# Patient Record
Sex: Male | Born: 1980 | Hispanic: Yes | Marital: Married | Smoking: Former smoker
Health system: Southern US, Community
[De-identification: ages and names within clinical notes are randomized; demographics above are authoritative.]

---

## 2014-10-16 ENCOUNTER — Ambulatory Visit: Payer: Self-pay | Attending: Internal Medicine

## 2014-12-18 ENCOUNTER — Encounter: Payer: Self-pay | Admitting: Internal Medicine

## 2014-12-18 ENCOUNTER — Encounter (HOSPITAL_COMMUNITY): Payer: Self-pay

## 2014-12-18 ENCOUNTER — Ambulatory Visit: Payer: Self-pay | Attending: Internal Medicine | Admitting: Internal Medicine

## 2014-12-18 ENCOUNTER — Emergency Department (HOSPITAL_COMMUNITY): Payer: Self-pay

## 2014-12-18 ENCOUNTER — Emergency Department (HOSPITAL_COMMUNITY)
Admission: EM | Admit: 2014-12-18 | Discharge: 2014-12-18 | Disposition: A | Discharge status: Home / Self Care | Payer: Self-pay | Attending: Emergency Medicine | Admitting: Emergency Medicine

## 2014-12-18 VITALS — BP 135/89 | HR 76 | Temp 98.8°F | Resp 16 | Wt 154.8 lb

## 2014-12-18 DIAGNOSIS — M62830 Muscle spasm of back: Secondary | ICD-10-CM | POA: Insufficient documentation

## 2014-12-18 DIAGNOSIS — Z87891 Personal history of nicotine dependence: Secondary | ICD-10-CM | POA: Insufficient documentation

## 2014-12-18 DIAGNOSIS — G8929 Other chronic pain: Secondary | ICD-10-CM | POA: Insufficient documentation

## 2014-12-18 DIAGNOSIS — M25551 Pain in right hip: Secondary | ICD-10-CM | POA: Insufficient documentation

## 2014-12-18 DIAGNOSIS — M545 Low back pain, unspecified: Secondary | ICD-10-CM | POA: Insufficient documentation

## 2014-12-18 DIAGNOSIS — Z833 Family history of diabetes mellitus: Secondary | ICD-10-CM

## 2014-12-18 DIAGNOSIS — Z139 Encounter for screening, unspecified: Secondary | ICD-10-CM | POA: Insufficient documentation

## 2014-12-18 DIAGNOSIS — M6281 Muscle weakness (generalized): Secondary | ICD-10-CM | POA: Insufficient documentation

## 2014-12-18 DIAGNOSIS — M549 Dorsalgia, unspecified: Secondary | ICD-10-CM

## 2014-12-18 LAB — COMPLETE METABOLIC PANEL WITH GFR
ALK PHOS: 73 U/L (ref 39–117)
ALT: 76 U/L — AB (ref 0–53)
AST: 34 U/L (ref 0–37)
Albumin: 4.3 g/dL (ref 3.5–5.2)
BILIRUBIN TOTAL: 0.5 mg/dL (ref 0.2–1.2)
BUN: 11 mg/dL (ref 6–23)
CO2: 24 mEq/L (ref 19–32)
Calcium: 9.1 mg/dL (ref 8.4–10.5)
Chloride: 104 mEq/L (ref 96–112)
Creat: 0.77 mg/dL (ref 0.50–1.35)
GFR, Est African American: >89 mL/min
Glucose, Bld: 103 mg/dL — ABNORMAL HIGH (ref 70–99)
Potassium: 4.4 mEq/L (ref 3.5–5.3)
SODIUM: 137 meq/L (ref 135–145)
Total Protein: 6.7 g/dL (ref 6.0–8.3)

## 2014-12-18 LAB — CBC WITH DIFFERENTIAL/PLATELET
BASOS ABS: 0.1 10*3/uL (ref 0.0–0.1)
Basophils Relative: 1 % (ref 0–1)
Eosinophils Absolute: 0.7 10*3/uL (ref 0.0–0.7)
Eosinophils Relative: 9 % — ABNORMAL HIGH (ref 0–5)
HEMATOCRIT: 45.6 % (ref 39.0–52.0)
Hemoglobin: 15.9 g/dL (ref 13.0–17.0)
Lymphocytes Relative: 33 % (ref 12–46)
Lymphs Abs: 2.5 10*3/uL (ref 0.7–4.0)
MCH: 30.3 pg (ref 26.0–34.0)
MCHC: 34.9 g/dL (ref 30.0–36.0)
MCV: 86.9 fL (ref 78.0–100.0)
MPV: 10.8 fL (ref 8.6–12.4)
Monocytes Absolute: 0.8 10*3/uL (ref 0.1–1.0)
Monocytes Relative: 10 % (ref 3–12)
NEUTROS ABS: 3.6 10*3/uL (ref 1.7–7.7)
NEUTROS PCT: 47 % (ref 43–77)
PLATELETS: 294 10*3/uL (ref 150–400)
RBC: 5.25 MIL/uL (ref 4.22–5.81)
RDW: 13.9 % (ref 11.5–15.5)
WBC: 7.6 10*3/uL (ref 4.0–10.5)

## 2014-12-18 LAB — LIPID PANEL
Cholesterol: 206 mg/dL — ABNORMAL HIGH (ref 0–200)
HDL: 21 mg/dL — AB (ref >=40)
Total CHOL/HDL Ratio: 9.8 Ratio
Triglycerides: 435 mg/dL — ABNORMAL HIGH (ref <150)

## 2014-12-18 LAB — TSH: TSH: 0.703 u[IU]/mL (ref 0.350–4.500)

## 2014-12-18 MED ORDER — IBUPROFEN 600 MG PO TABS: 600.0000 mg | ORAL_TABLET | Freq: Three times a day (TID) | ORAL | Status: AC | PRN

## 2014-12-18 MED ORDER — CYCLOBENZAPRINE HCL 10 MG PO TABS: 10.0000 mg | ORAL_TABLET | Freq: Every day | ORAL | Status: AC

## 2014-12-18 NOTE — Progress Notes (Signed)
In house interpreter used Patient here to establish care Complains of lower back pain and pain to his right hip and leg Patient did state he fell a few years back and that could be what is causing his pain

## 2014-12-18 NOTE — ED Provider Notes (Signed)
CSN: [id number]     Arrival date & time 12/18/14  1528 History  This chart was scribed for Charles Peliffany Zenia Guest, PA-C, working with Blake DivineJohn Wofford, MD by Elon SpannerGarrett Cook, ED Scribe. This patient was seen in room TR07C/TR07C and the patient's care was started at 4:13 PM.   Chief Complaint  Patient presents with  . Back Pain  . Hip Pain   The history is provided by the patient. A language interpreter was used.   HPI Comments: Charles Dodson is a spanish speaking   34 y.o. male with a history of chronic back pain who [facility] where he was prescribed Flexeril and ibuprofen, which have not yet been filled, and told to go to he hospital for imaging of his back, it does not appear that he was supposed to check into the emergency department as he tells me he already saw the doctor and his here for his xrays.  He states that initially his back pain onset several years ago after a fall from a significant height but this episode has lasted 8-9 moths and is rated currently 4-5/10.  He also notes radiation down both his legs with right worse than left and some bilateral leg weakness.  Patient denies bowel or bladder incontinence, fever.    Patient also notes some intermittent constipation.  The patient had an appointment with a physician just prior to arrival . I reviewed this physicians note and there was no notations saying go to the ER.  Patient reports he History reviewed. No pertinent past medical history. History reviewed. No pertinent past surgical history. Family History  Problem Relation Age of Onset  . Hypertension Maternal Aunt   . Heart disease Maternal Aunt   . Diabetes Maternal Uncle    History  Substance Use Topics  . Smoking status: Former Games developermoker  . Smokeless tobacco: Not on file  . Alcohol Use: 0.0 oz/week    0 Standard drinks or equivalent per week    Review of Systems  Constitutional: Negative for fever.  Musculoskeletal: Positive  for back pain.      Allergies  Review of patient's allergies indicates no known allergies.  Home Medications   Prior to Admission medications   Medication Sig Start Date End Date Taking? Authorizing Provider  cyclobenzaprine (FLEXERIL) 10 MG tablet Take 1 tablet (10 mg total) by mouth at bedtime. 12/18/14   Doris Cheadleeepak Advani, MD  ibuprofen (ADVIL,MOTRIN) 600 MG tablet Take 1 tablet (600 mg total) by mouth every 8 (eight) hours as needed. 12/18/14   Doris Cheadleeepak Advani, MD   BP 115/74 mmHg  Pulse 66  Temp(Src) 98.4 F (36.9 C) (Oral)  Resp 14  Wt 153 lb 7 oz (69.599 kg)  SpO2 99% Physical Exam  Constitutional: He is oriented to person, place, and time. He appears well-developed and well-nourished. No distress.  HENT:  Head: Normocephalic and atraumatic.  Eyes: Conjunctivae and EOM are normal.  Neck: Neck supple. No tracheal deviation present.  Cardiovascular: Normal rate.   Pulmonary/Chest: Effort normal. No respiratory distress.  Musculoskeletal: Normal range of motion.       Back:  Pt has equal strength to bilateral lower extremities.  Neurosensory function adequate to both legs No clonus on dorsiflextion Skin color is normal. Skin is warm and moist.  I see no step off deformity, no midline bony tenderness.  Pt is able to ambulate.  No crepitus, laceration, effusion, induration, lesions, swelling.   Pedal pulses are symmetrical and  palpable bilaterally  lumbar tenderness to palpation of right paraspinel muscles  Neurological: He is alert and oriented to person, place, and time.  Skin: Skin is warm and dry.  Psychiatric: He has a normal mood and affect. His behavior is normal.  Nursing note and vitals reviewed.   ED Course  Procedures (including critical care time)  DIAGNOSTIC STUDIES: Oxygen Saturation is 98% on RA, normal by my interpretation.    COORDINATION OF CARE:  4:24 PM Discussed treatment plan with patient at bedside.  Patient acknowledges and agrees with plan.   Patient was given Rx by the physician just before, I agree with the medication recommendations. Will give referral to Ortho.  Labs Review Labs Reviewed - No data to display  Imaging Review Dg Lumbar Spine Complete  12/18/2014   CLINICAL DATA:  Worsening back pain for 8-9 months with radiation to both legs, right side greater than left.  EXAM: LUMBAR SPINE - COMPLETE 4+ VIEW  COMPARISON:  None.  FINDINGS: There is no evidence of lumbar spine fracture. Alignment is normal. Intervertebral disc spaces are maintained.  IMPRESSION: Negative.   Electronically Signed   By: Myles Rosenthal M.D.   On: 12/18/2014 17:52   Dg Hip Unilat With Pelvis 2-3 Views Right  12/18/2014   CLINICAL DATA:  Right hip pain for 8-9 months.  No acute injury.  EXAM: RIGHT HIP (WITH PELVIS) 2-3 VIEWS  COMPARISON:  None.  FINDINGS: There is no evidence of hip fracture or dislocation. There is no evidence of arthropathy or other focal bone abnormality.  IMPRESSION: Negative.   Electronically Signed   By: Myles Rosenthal M.D.   On: 12/18/2014 17:53     EKG Interpretation None      MDM   Final diagnoses:  Chronic back pain   34 y.o.Charles Dodson's  with back pain. No neurological deficits and normal neuro exam. Patient can walk. No loss of bowel or bladder control. No concern for cauda equina at this time base on HPI and physical exam findings. No fever, night sweats, weight loss, h/o cancer, IVDU.   Patient Plan 1. Medications: NSAIDs and muscle relaxer. Cont usual home medications unless otherwise directed. 2. Treatment: rest, drink plenty of fluids, gentle stretching as discussed, alternate ice and heat  3. Follow Up: Please followup with your primary doctor for discussion of your diagnoses and further evaluation after today's visit; if you do not have a primary care doctor use the resource guide provided to find one  Advised to follow-up with the orthopedist if symptoms do not start to resolve in the next 2-3 days. If  develop loss of bowel or urinary control return to the ED as soon as possible for further evaluation. To take the medications as prescribed as they can cause harm if not taken appropriately.   Vital signs are stable at discharge. Filed Vitals:   12/18/14 1804  BP: 115/74  Pulse: 66  Temp: 98.4 F (36.9 C)  Resp: 14    Patient/guardian has voiced understanding and agreed to follow-up with the PCP or specialist.  I personally performed the services described in this documentation, which was scribed in my presence. The recorded information has been reviewed and is accurate.    Charles Pel, PA-C 12/19/14 1319  Blake Divine, MD 12/19/14 1623

## 2014-12-18 NOTE — ED Notes (Signed)
Declined W/C at D/C and was escorted to lobby by RN. 

## 2014-12-18 NOTE — Discharge Instructions (Signed)
Dolor De Espalda Crnico (Chronic Back Pain)  Cuando el dolor en la espalda dura ms de 3 meses, se denomina dolor de espalda crnico. Las personas que sufren dolor de espalda crnico generalmente pasan por perodos en los que es ms intenso (brotes).  CAUSAS  El dolor de espalda crnico puede estar originado en el desgaste degeneracin) de las diferentes estructuras de la espalda. Estas estructuras incluyen:  Los huesos de la columna vertebral (vrtebras) y las articulaciones rodean la mdula espinal y las races nerviosas (facetas).  Hay un tejido fibroso y fuerte que conecta las vrtebras (ligamentos). La degeneracin de estas estructuras provoca presin [employer]. Esto puede causar Engineer, manufacturing systems.  INSTRUCCIONES PARA EL CUIDADO EN EL HOGAR   Evite encorvarse, levantar mucho peso, permanecer sentado por [employer], y las actividades que puedan empeorar el problema.  Tome breves perodos de descanso a travs del da para reducir Chief Technology Officer durante los brotes. Recostarse o Personal assistant de pie generalmente es mejor que permanecer sentado para Lawyer.  Tome slo medicamentos de venta libre o recetados, segn las indicaciones del mdico. SOLICITE ATENCIN MDICA DE INMEDIATO SI:  Siente debilidad intensa o adormecimiento en una de sus piernas o pies.  Tiene dificultad para controlar la vejiga o el intestino.  Presenta nuseas, vmitos, dolor abdominal, falta de aire o desmayos. Document Released: 08/25/2005 Document Revised: 11/17/2011 [facility] Patient Information 2015 [place], [place]. This information is not intended to replace advice given to you by your health care provider. Make sure you discuss any questions you have with your health care provider.  Dolor de espalda en el adulto (Back Pain, Adult)  El dolor de cintura es frecuente. Aproximadamente 1 de cada 5 personas lo sufren.La causa rara vez pone en peligro la vida. Con frecuencia mejora luego de algn  tiempo.Alrededor de la mitad de las personas que sufren un inicio sbito de dolor de cintura, se sentirn mejor luego de 2 semanas. Aproximadamente 8 de cada 10 se sentirn mejor luego de 6 semanas.  CAUSAS  Algunas causas comunes son:   Distensin de los msculos o ligamentos que sostienen la columna vertebral.  Desgaste (degeneracin) de los discos vertebrales.  Artritis.  Traumatismos directos en la espalda. DIAGNSTICO  La mayor parte de las veces, la causa directa no se conoce.Sin embargo, Chief Technology Officer puede tratarse efectivamente an cuando no se Best boy.Una de las formas ms precisas de asegurar que la causa del dolor no constituye un peligro es responder a las preguntas del mdico acerca de su salud y sus sntomas. Si el mdico necesita ms informacin, podr indicar anlisis de laboratorio o Education officer, environmental un diagnstico por imgenes (radiografas o Health visitor).Sin embargo, aunque las [employer] modificaciones, generalmente no es necesaria la [place].  INSTRUCCIONES PARA EL CUIDADO EN EL HOGAR  En algunas personas, el dolor de espalda vuelve.Como rara vez es peligroso, los pacientes pueden aprender a Interior and spatial designer.   Mantngase activo. Si permanece sentado o de pie mucho tiempo en el mismo lugar, se tensiona la espalda.  No se siente, maneje ni se quede parado en un mismo lugar por ms de 30 minutos. Realice caminatas cortas en superficies planas ni bien el dolor haya cedido. Trate de Copy tiempo que camina .  No se quede en la cama.Si hace reposo durante ms de 1 o 2 das, puede Estate agent.  No evite los ejercicios ni el trabajo.El cuerpo est hecho para moverse.No es peligroso estar activo, aunque le duela la  espalda.La espalda se curar ms rpido si contina sus actividades antes de que el dolor se vaya.  Preste atencin a su cuerpo cuando se incline y se levante. Muchas personas sienten menos molestias cuando levantan  objetos si doblan las rodillas, mantienen la carga cerca del cuerpo y evitan torcerse. Generalmente, las posiciones ms cmodas son las que ejercen menos tensin en la espalda en recuperacin.  Encuentre una posicin cmoda para dormir. Utilice un colchn firme y recustese de Warnercostado. Doble ligeramente sus rodillas. Si se recuesta sobre su espalda, coloque una almohada debajo de sus rodillas.  Tome slo medicamentos de venta libre o recetados, segn las indicaciones del mdico. Los medicamentos de venta libre para Primary school teachercalmar el dolor y reducir Futures traderla inflamacin, son los que en general ms ayudan.El mdico podr prescribirle relajantes musculares.Estos medicamentos calman el dolor de modo que pueda retornar a sus actividades normales y a Investment banker, operationalrealizar ejercicios saludables.  Aplique hielo sobre la zona lesionada.  Ponga el hielo en una bolsa plstica.  Colquese una toalla entre la piel y la bolsa de hielo.  Deje la bolsa de hielo durante 15 a 20 minutos 3 a 4 veces por da, durante los primeros 2  3 das. Luego podr alternar Eusebio Meentre calor y hielo para reducir Chief Technology Officerel dolor y los espasmos.  Consulte a su mdico si puede tratar de hacer ejercicios para la espalda y recibir un masaje suave. Pueden ser beneficiosos.  Evite sentirse ansioso o estresado.El estrs aumenta la tensin muscular y puede empeorar el dolor de espalda.Es importante reconocer cuando est ansioso o estresado y aprender la forma de controlarlos.El ejercicio es una gran opcin. SOLICITE ATENCIN MDICA SI:   Siente un dolor que no se alivia con reposo o medicamentos.  El dolor no mejora en 1 semana.  Desarrolla nuevos sntomas.  No se siente bien en general. SOLICITE ATENCIN MDICA DE INMEDIATO SI:  Siente un dolor que se irradia desde la espalda hacia sus piernas.  Desarrolla nuevos problemas en el intestino o la vejiga.  Siente debilidad o adormecimiento inusual en sus brazos o piernas.  Presenta nuseas o  vmitos.  Presenta dolor abdominal.  Se siente desfalleciente. Document Released: 08/25/2005 Document Revised: 02/24/2012 [facility] Patient Information 2015 [place]. This information is not intended to replace advice given to you by your health care provider. Make sure you discuss any questions you have with your health care provider.

## 2014-12-18 NOTE — Progress Notes (Signed)
Patient Demographics  Charles Dodson, is a 34 y.o. male  ZOX:[id number]  [id number]  DOB - 1981/05/08  CC:  Chief Complaint  Patient presents with  . Establish Care       HPI: [name] is a 34 y.o. male here today to establish medical care. Patient has history of chronic lower back pain, does report history of trauma several years ago, sometimes the pain shoots down to the right leg, denies any recent fall or trauma denies any incontinence, patient has taken ibuprofen which helps him with the symptoms, patient also has family history of diabetes, he is concerned if he has the diabetes as well. Patient has No headache, No chest pain, No abdominal pain - No Nausea, No new weakness tingling or numbness, No Cough - SOB.  No Known Allergies History reviewed. No pertinent past medical history. No current outpatient prescriptions on file prior to visit.   No current facility-administered medications on file prior to visit.   Family History  Problem Relation Age of Onset  . Hypertension Maternal Aunt   . Heart disease Maternal Aunt   . Diabetes Maternal Uncle    History   Social History  . Marital Status: Married    Spouse Name: N/A  . Number of Children: N/A  . Years of Education: N/A   Occupational History  . Not on file.   Social History Main Topics  . Smoking status: Former Games developer  . Smokeless tobacco: Not on file  . Alcohol Use: 0.0 oz/week    0 Standard drinks or equivalent per week  . Drug Use: Not on file  . Sexual Activity: Not on file   Other Topics Concern  . Not on file   Social History Narrative  . No narrative on file    Review of Systems: Constitutional: Negative for fever, chills, diaphoresis, activity change, appetite change and fatigue. HENT: Negative for ear pain, nosebleeds, congestion, facial swelling, rhinorrhea, neck pain, neck stiffness and ear discharge.  Eyes: Negative for pain, discharge, redness, itching and  visual disturbance. Respiratory: Negative for cough, choking, chest tightness, shortness of breath, wheezing and stridor.  Cardiovascular: Negative for chest pain, palpitations and leg swelling. Gastrointestinal: Negative for abdominal distention. Genitourinary: Negative for dysuria, urgency, frequency, hematuria, flank pain, decreased urine volume, difficulty urinating and dyspareunia.  Musculoskeletal: Negative for back pain, joint swelling, arthralgia and gait problem. Neurological: Negative for dizziness, tremors, seizures, syncope, facial asymmetry, speech difficulty, weakness, light-headedness, numbness and headaches.  Hematological: Negative for adenopathy. Does not bruise/bleed easily. Psychiatric/Behavioral: Negative for hallucinations, behavioral problems, confusion, dysphoric mood, decreased concentration and agitation.    Objective:   Filed Vitals:   12/18/14 1408  BP: 135/89  Pulse: 76  Temp: 98.8 F (37.1 C)  Resp: 16    Physical Exam: Constitutional: Patient appears well-developed and well-nourished. No distress. HENT: Normocephalic, atraumatic, External right and left ear normal. Oropharynx is clear and moist.  Eyes: Conjunctivae and EOM are normal. PERRLA, no scleral icterus. Neck: Normal ROM. Neck supple. No JVD. No tracheal deviation. No thyromegaly. CVS: RRR, S1/S2 +, no murmurs, no gallops, no carotid bruit.  Pulmonary: Effort and breath sounds normal, no stridor, rhonchi, wheezes, rales.  Abdominal: Soft. BS +, no distension, tenderness, rebound or guarding.  Musculoskeletal: Normal range of motion. Right lower lumbar paraspinal tenderness, SLR negative Neuro: Alert. Normal reflexes, muscle tone coordination. No cranial nerve deficit. Skin: Skin is warm and dry. No rash noted. Not diaphoretic. No erythema. No pallor. Psychiatric: Normal  mood and affect. Behavior, judgment, thought content normal.  No results found for: WBC, HGB, HCT, MCV, PLT No results found  for: CREATININE, BUN, NA, K, CL, CO2  No results found for: HGBA1C Lipid Panel  No results found for: CHOL, TRIG, HDL, CHOLHDL, VLDL, LDLCALC     Assessment and plan:   1. Chronic lower back pain  - DG Lumbar Spine Complete; Future - ibuprofen (ADVIL,MOTRIN) 600 MG tablet; Take 1 tablet (600 mg total) by mouth every 8 (eight) hours as needed.  Dispense: 30 tablet; Refill: 1  2. Family history of diabetes mellitus (DM)  - Hemoglobin A1c  3. Screening Ordered baseline blood work  - CBC with Differential/Platelet - COMPLETE METABOLIC PANEL WITH GFR - Lipid panel - Vit D  25 hydroxy (rtn osteoporosis monitoring) - TSH  4. Back muscle spasm Advised patient to apply heating pad.  - cyclobenzaprine (FLEXERIL) 10 MG tablet; Take 1 tablet (10 mg total) by mouth at bedtime.  Dispense: 30 tablet; Refill: 1   Return in about 3 months (around 03/19/2015), or if symptoms worsen or fail to improve.    The patient was given clear instructions to go to ER or return to medical center if symptoms don't improve, worsen or new problems develop. The patient verbalized understanding. The patient was told to call to get lab results if they haven't heard anything in the next week.    This note has been created with Education officer, environmentalDragon speech recognition software and smart phrase technology. Any transcriptional errors are unintentional.   Doris CheadleADVANI, Roddrick Sharron, MD

## 2014-12-19 ENCOUNTER — Other Ambulatory Visit: Payer: Self-pay

## 2014-12-19 ENCOUNTER — Telehealth: Payer: Self-pay

## 2014-12-19 LAB — HEMOGLOBIN A1C
HEMOGLOBIN A1C: 5.9 % — AB (ref <5.7)
Mean Plasma Glucose: 123 mg/dL — ABNORMAL HIGH (ref <117)

## 2014-12-19 LAB — VITAMIN D 25 HYDROXY (VIT D DEFICIENCY, FRACTURES): VIT D 25 HYDROXY: 12 ng/mL — AB (ref 30–100)

## 2014-12-19 MED ORDER — GEMFIBROZIL 600 MG PO TABS: 600.0000 mg | ORAL_TABLET | Freq: Two times a day (BID) | ORAL | Status: AC

## 2014-12-19 MED ORDER — VITAMIN D (ERGOCALCIFEROL) 1.25 MG (50000 UNIT) PO CAPS: 50000.0000 [IU] | ORAL_CAPSULE | ORAL | Status: DC

## 2014-12-19 MED ORDER — VITAMIN D (ERGOCALCIFEROL) 1.25 MG (50000 UNIT) PO CAPS: 50000.0000 [IU] | ORAL_CAPSULE | ORAL | Status: AC

## 2014-12-19 MED ORDER — GEMFIBROZIL 600 MG PO TABS: 600.0000 mg | ORAL_TABLET | Freq: Two times a day (BID) | ORAL | Status: DC

## 2014-12-19 NOTE — Telephone Encounter (Signed)
Interpreter line used Charles Dodson id # [id number] Patient not available Unable to leave message  Mail box not set up

## 2014-12-19 NOTE — Telephone Encounter (Signed)
-----   Message from Doris Cheadleeepak Advani, MD sent at 12/19/2014  9:44 AM EDT ----- Blood work reviewed, noticed low vitamin D, call patient advise to start ergocalciferol 50,000 units once a week for the duration of  12 weeks, then take OTC vitamin d 2000 units daily.  noticed hemoglobin A1c of 5.9%, patient has prediabetes, call and advise patient for low carbohydrate diet. , noticed elevated triglycerides, advise patient for low fat diet. And start taking lopid 600 mg bid will recheck fasting lipid panel on the next visit

## 2015-01-08 NOTE — Telephone Encounter (Signed)
Patient came into clinic requesting bloodwork results. Please f/u with patient to review results.

## 2015-04-30 ENCOUNTER — Ambulatory Visit: Payer: Self-pay

## 2016-11-03 IMAGING — CR DG HIP (WITH OR WITHOUT PELVIS) 2-3V*R*
3 series · 3 of 3 positions shown · non-contrast
Comparison: None.

CLINICAL DATA: Right hip pain for 8-9 months.  No acute injury.

EXAM:
RIGHT HIP (WITH PELVIS) 2-3 VIEWS

[pelvis ap]
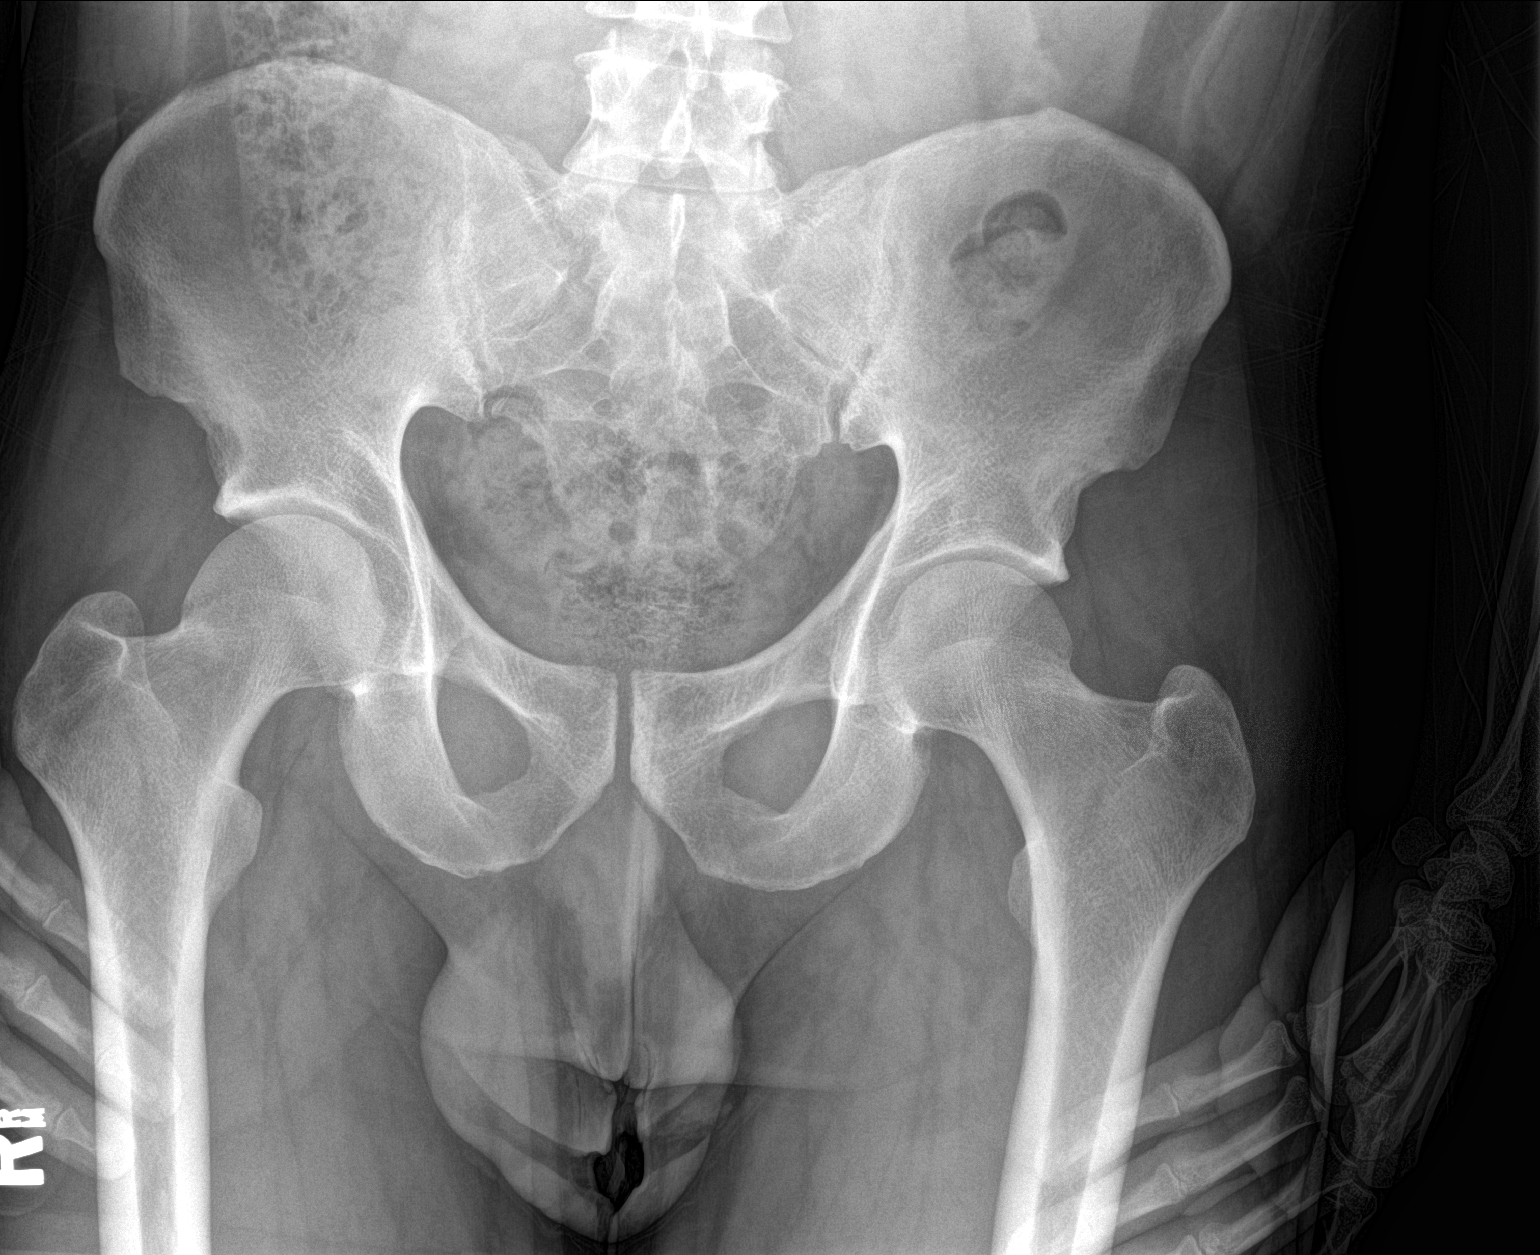

[hip ap]
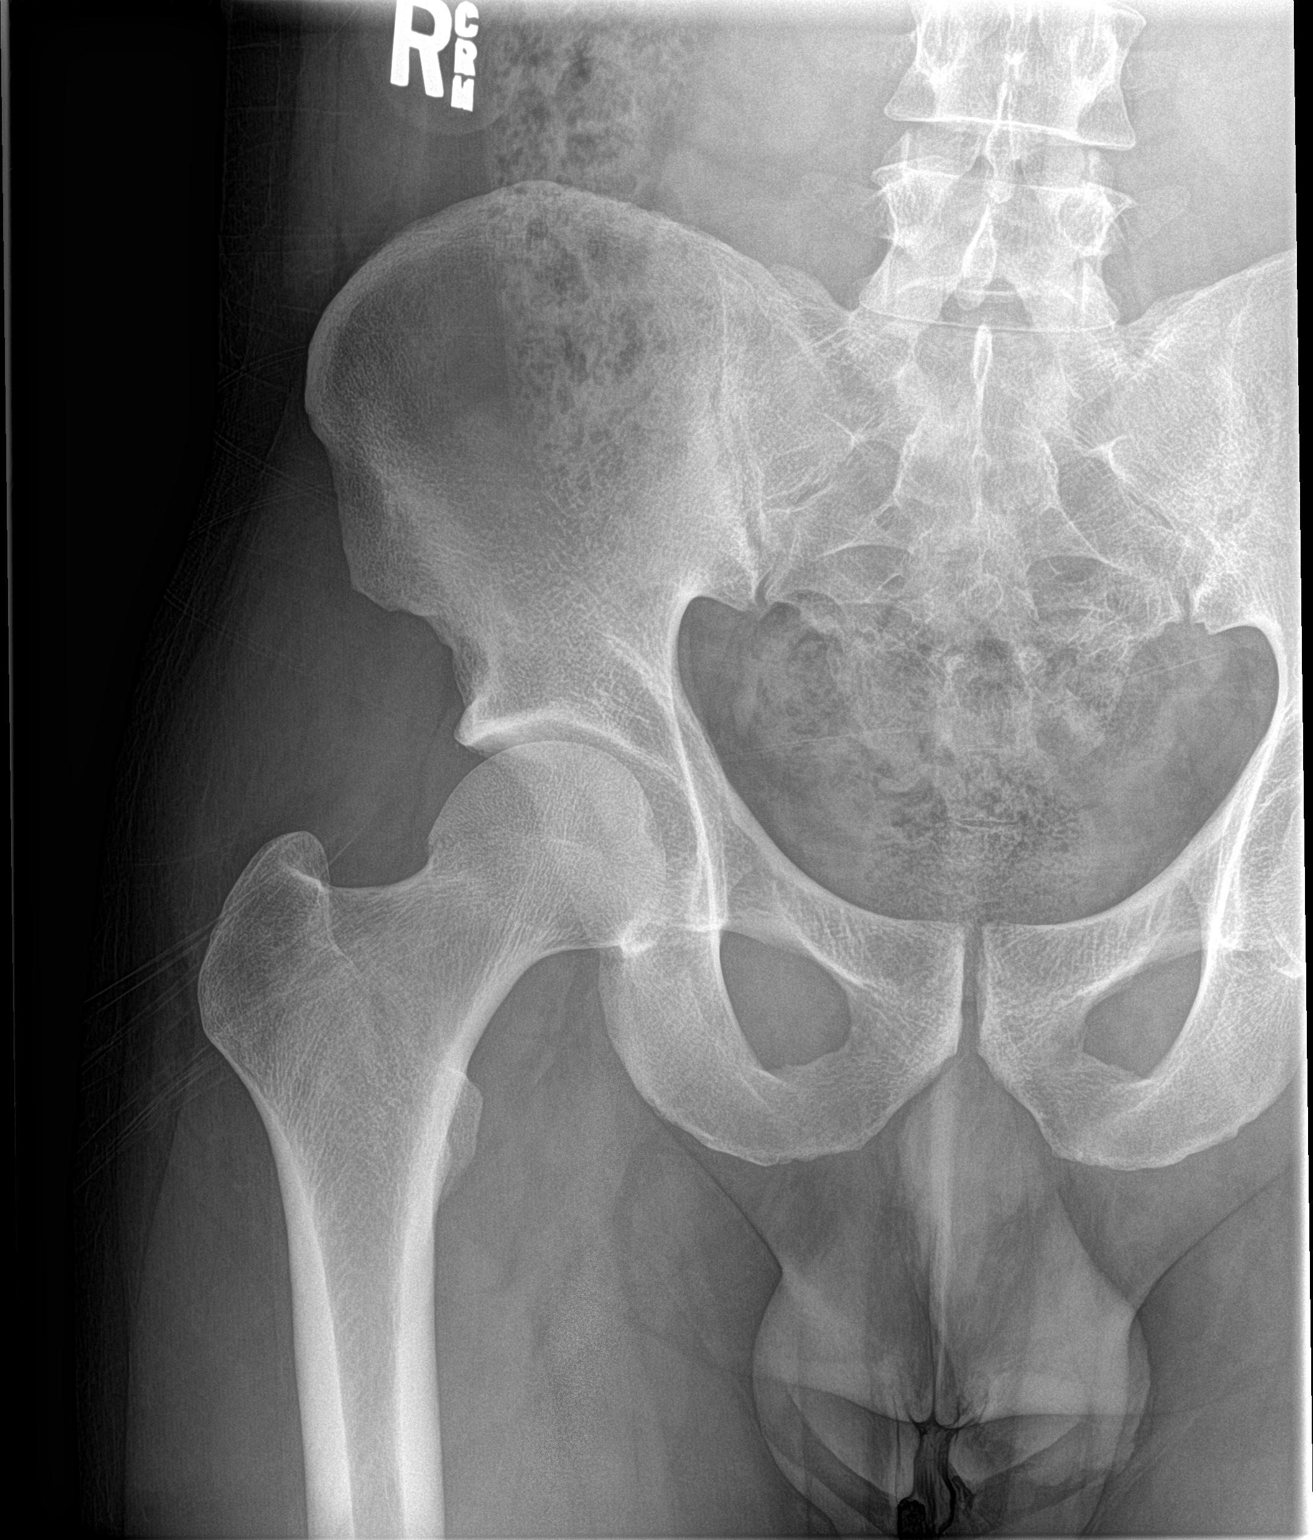

[hip lat]
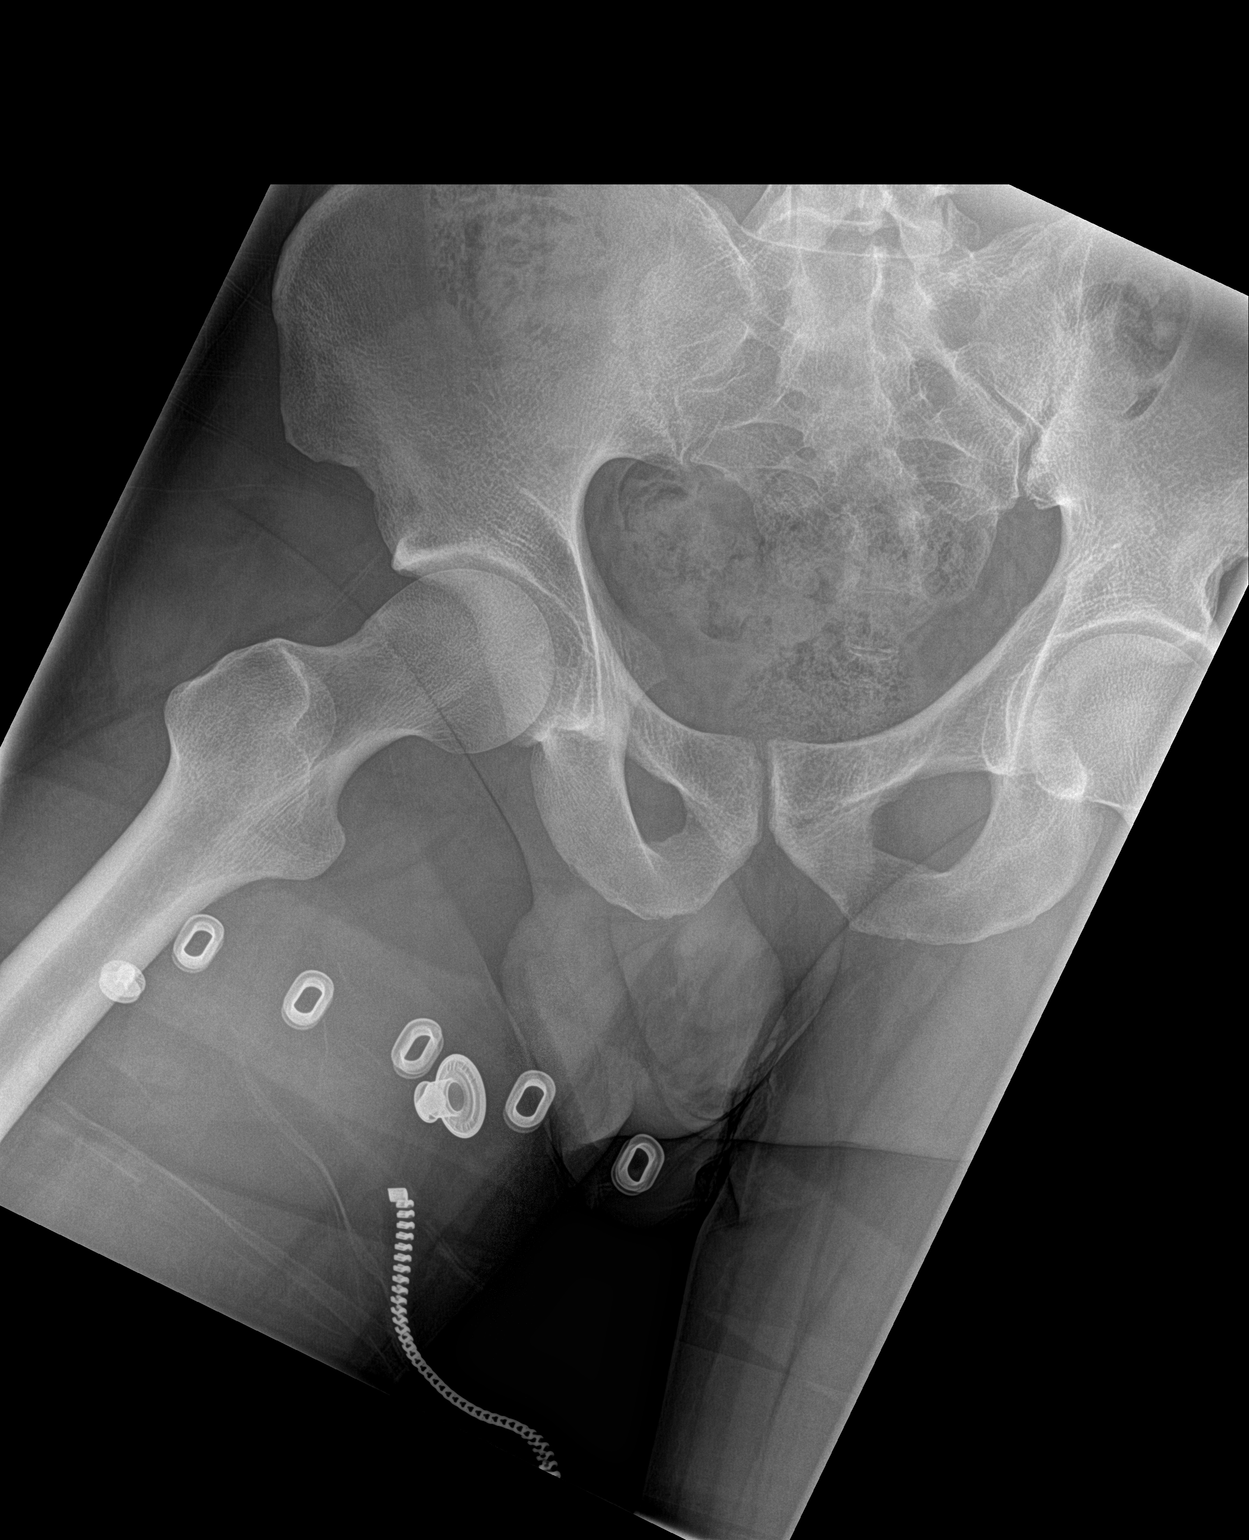

[3 of 3 positions shown; findings below may reference images not displayed]

FINDINGS: There is no evidence of hip fracture or dislocation. There is no
evidence of arthropathy or other focal bone abnormality.
IMPRESSION: Negative.
